# Patient Record
Sex: Female | Born: 1965 | Race: White | Hispanic: No | Smoking: Current every day smoker
Health system: Southern US, Community
[De-identification: ages and names within clinical notes are randomized; demographics above are authoritative.]

---

## 2004-09-12 ENCOUNTER — Emergency Department: Payer: Self-pay | Admitting: Emergency Medicine

## 2008-10-21 ENCOUNTER — Emergency Department: Payer: Self-pay | Admitting: Emergency Medicine

## 2008-12-22 ENCOUNTER — Emergency Department: Payer: Self-pay | Admitting: Emergency Medicine

## 2009-01-04 ENCOUNTER — Emergency Department: Payer: Self-pay | Admitting: Emergency Medicine

## 2009-03-07 ENCOUNTER — Emergency Department: Payer: Self-pay | Admitting: Internal Medicine

## 2012-05-16 ENCOUNTER — Emergency Department: Payer: Self-pay | Admitting: Emergency Medicine

## 2012-05-22 LAB — WOUND CULTURE

## 2013-03-06 ENCOUNTER — Emergency Department: Payer: Self-pay | Admitting: Internal Medicine

## 2014-01-02 ENCOUNTER — Emergency Department: Payer: Self-pay | Admitting: Emergency Medicine

## 2014-06-28 ENCOUNTER — Emergency Department: Payer: Self-pay | Admitting: Emergency Medicine

## 2014-06-30 ENCOUNTER — Emergency Department: Payer: Self-pay | Admitting: Internal Medicine

## 2014-06-30 LAB — CBC
HCT: 35.3 % (ref 35.0–47.0)
HGB: 11.3 g/dL — ABNORMAL LOW (ref 12.0–16.0)
MCH: 26 pg (ref 26.0–34.0)
MCHC: 31.9 g/dL — AB (ref 32.0–36.0)
MCV: 81 fL (ref 80–100)
Platelet: 140 10*3/uL — ABNORMAL LOW (ref 150–440)
RBC: 4.33 10*6/uL (ref 3.80–5.20)
RDW: 15.8 % — AB (ref 11.5–14.5)
WBC: 3.8 10*3/uL (ref 3.6–11.0)

## 2014-06-30 LAB — BASIC METABOLIC PANEL
Anion Gap: 8 (ref 7–16)
BUN: 26 mg/dL — AB (ref 7–18)
CALCIUM: 8.2 mg/dL — AB (ref 8.5–10.1)
Chloride: 110 mmol/L — ABNORMAL HIGH (ref 98–107)
Co2: 21 mmol/L (ref 21–32)
Creatinine: 1.57 mg/dL — ABNORMAL HIGH (ref 0.60–1.30)
EGFR (African American): 46 — ABNORMAL LOW
EGFR (Non-African Amer.): 38 — ABNORMAL LOW
GLUCOSE: 122 mg/dL — AB (ref 65–99)
OSMOLALITY: 284 (ref 275–301)
Potassium: 3.7 mmol/L (ref 3.5–5.1)
Sodium: 139 mmol/L (ref 136–145)

## 2014-07-01 ENCOUNTER — Emergency Department: Payer: Self-pay | Admitting: Emergency Medicine

## 2014-09-28 ENCOUNTER — Emergency Department: Payer: Self-pay | Admitting: Emergency Medicine

## 2014-10-25 ENCOUNTER — Emergency Department: Payer: Self-pay | Admitting: Internal Medicine

## 2014-11-16 ENCOUNTER — Emergency Department: Admit: 2014-11-16 | Discharge status: Against Medical Advice | Payer: Self-pay | Admitting: Emergency Medicine

## 2015-06-07 ENCOUNTER — Emergency Department
Admission: EM | Admit: 2015-06-07 | Discharge: 2015-06-07 | Disposition: A | Discharge status: Home / Self Care | Payer: Self-pay | Attending: Emergency Medicine | Admitting: Emergency Medicine

## 2015-06-07 ENCOUNTER — Encounter: Payer: Self-pay | Admitting: *Deleted

## 2015-06-07 DIAGNOSIS — Y9289 Other specified places as the place of occurrence of the external cause: Secondary | ICD-10-CM | POA: Insufficient documentation

## 2015-06-07 DIAGNOSIS — Y998 Other external cause status: Secondary | ICD-10-CM | POA: Insufficient documentation

## 2015-06-07 DIAGNOSIS — S61011A Laceration without foreign body of right thumb without damage to nail, initial encounter: Secondary | ICD-10-CM | POA: Insufficient documentation

## 2015-06-07 DIAGNOSIS — Z23 Encounter for immunization: Secondary | ICD-10-CM | POA: Insufficient documentation

## 2015-06-07 DIAGNOSIS — Y9389 Activity, other specified: Secondary | ICD-10-CM | POA: Insufficient documentation

## 2015-06-07 DIAGNOSIS — Z720 Tobacco use: Secondary | ICD-10-CM | POA: Insufficient documentation

## 2015-06-07 DIAGNOSIS — Y288XXA Contact with other sharp object, undetermined intent, initial encounter: Secondary | ICD-10-CM | POA: Insufficient documentation

## 2015-06-07 MED ORDER — TRAMADOL HCL 50 MG PO TABS: 50.0000 mg | ORAL_TABLET | Freq: Four times a day (QID) | ORAL | Status: DC | PRN

## 2015-06-07 MED ORDER — TETANUS-DIPHTHERIA TOXOIDS TD 5-2 LFU IM INJ: 0.5000 mL | INJECTION | Freq: Once | INTRAMUSCULAR | Status: AC

## 2015-06-07 MED ORDER — TETANUS-DIPHTH-ACELL PERTUSSIS 5-2.5-18.5 LF-MCG/0.5 IM SUSP
INTRAMUSCULAR | Status: AC
  Administered 2015-06-07: 0.5 mL via INTRAMUSCULAR
  Filled 2015-06-07: qty 0.5

## 2015-06-07 MED ORDER — BACITRACIN ZINC 500 UNIT/GM EX OINT
TOPICAL_OINTMENT | Freq: Two times a day (BID) | CUTANEOUS | Status: DC
  Administered 2015-06-07: 17:00:00 via TOPICAL

## 2015-06-07 MED ORDER — TRIPLE ANTIBIOTIC 3.5-400-5000 EX OINT: TOPICAL_OINTMENT | Freq: Once | CUTANEOUS | Status: DC

## 2015-06-07 MED ORDER — BACITRACIN ZINC 500 UNIT/GM EX OINT
TOPICAL_OINTMENT | CUTANEOUS | Status: AC
  Filled 2015-06-07: qty 0.9

## 2015-06-07 MED ORDER — CEPHALEXIN 250 MG/5ML PO SUSR: 250.0000 mg | Freq: Four times a day (QID) | ORAL | Status: DC

## 2015-06-07 MED ORDER — IBUPROFEN 800 MG PO TABS: 800.0000 mg | ORAL_TABLET | Freq: Three times a day (TID) | ORAL | Status: DC | PRN

## 2015-06-07 NOTE — ED Provider Notes (Signed)
[facility] Emergency Department Provider Note  ____________________________________________  Time seen: Approximately 4:31 PM  I have reviewed the triage vital signs and the nursing notes.   HISTORY  Chief Complaint Laceration    HPI Jessica Harper is a 49 y.o. female patient presented with a 2 day old laceration to the base of right thumb. He states she was cut by a piece of aluminum. Patient states she cleaned area with hydrogen peroxide and applied Neosporin. Patient awakened this morning with erythematous and edematous right thumb. Patient rates the pain scales 8/10 described as "throbbing". Patient denies any discharge from the wound site. She denies any loss of sensation or loss of function of the affected digit. She is right-hand dominant.   History reviewed. No pertinent past medical history.  There are no active problems to display for this patient.   History reviewed. No pertinent past surgical history.  Current Outpatient Rx  Name  Route  Sig  Dispense  Refill  . cephALEXin (KEFLEX) 250 MG/5ML suspension   Oral   Take 5 mLs (250 mg total) by mouth 4 (four) times daily.   200 mL   0   . ibuprofen (ADVIL,MOTRIN) 800 MG tablet   Oral   Take 1 tablet (800 mg total) by mouth every 8 (eight) hours as needed for moderate pain.   15 tablet   0   . traMADol (ULTRAM) 50 MG tablet   Oral   Take 1 tablet (50 mg total) by mouth every 6 (six) hours as needed for moderate pain.   12 tablet   0     Allergies Sulfa antibiotics  No family history on file.  Social History Social History  Substance Use Topics  . Smoking status: Current Every Day Smoker  . Smokeless tobacco: None  . Alcohol Use: No    Review of Systems Constitutional: No fever/chills Eyes: No visual changes. ENT: No sore throat. Cardiovascular: Denies chest pain. Respiratory: Denies shortness of breath. Gastrointestinal: No abdominal pain.  No nausea, no vomiting.   No diarrhea.  No constipation. Genitourinary: Negative for dysuria. Musculoskeletal: Negative for back pain. Skin: Negative for rash. Laceration base of right thumb Neurological: Negative for headaches, focal weakness or numbness. Allergic/Immunilogical: Sulfa  10-point ROS otherwise negative.  ____________________________________________   PHYSICAL EXAM:  VITAL SIGNS: ED Triage Vitals  Enc Vitals Group     BP 06/07/15 1613 132/76 mmHg     Pulse Rate 06/07/15 1613 92     Resp 06/07/15 1613 20     Temp 06/07/15 1613 98.2 F (36.8 C)     Temp Source 06/07/15 1613 Oral     SpO2 06/07/15 1613 99 %     Weight 06/07/15 1613 145 lb (65.772 kg)     Height 06/07/15 1613 5\' 5"  (1.651 m)     Head Cir --      Peak Flow --      Pain Score 06/07/15 1614 8     Pain Loc --      Pain Edu? --      Excl. in GC? --     Constitutional: Alert and oriented. Well appearing and in no acute distress. Eyes: Conjunctivae are normal. PERRL. EOMI. Head: Atraumatic. Nose: No congestion/rhinnorhea. Mouth/Throat: Mucous membranes are moist.  Oropharynx non-erythematous. Neck: No stridor. No cervical spine tenderness to palpation. Hematological/Lymphatic/Immunilogical: No cervical lymphadenopathy. Cardiovascular: Normal rate, regular rhythm. Grossly normal heart sounds.  Good peripheral circulation. Respiratory: Normal respiratory effort.  No retractions. Lungs  CTAB. Gastrointestinal: Soft and nontender. No distention. No abdominal bruits. No CVA tenderness. Musculoskeletal: No lower extremity tenderness nor edema.  No joint effusions. Neurologic:  Normal speech and language. No gross focal neurologic deficits are appreciated. No gait instability. Skin:  Skin is warm, dry and intact. No rash noted. Erythema mild edema to the base of the right thumb. 0.5 cm laceration Psychiatric: Mood and affect are normal. Speech and behavior are normal.  ____________________________________________   LABS (all  labs ordered are listed, but only abnormal results are displayed)  Labs Reviewed - No data to display ____________________________________________  EKG   ____________________________________________  RADIOLOGY   ____________________________________________   PROCEDURES  Procedure(s) performed: None  Critical Care performed: No  ____________________________________________   INITIAL IMPRESSION / ASSESSMENT AND PLAN / ED COURSE  Pertinent labs & imaging results that were available during my care of the patient were reviewed by me and considered in my medical decision making (see chart for details).  Laceration base of right thumb greater than 24 hours. Area was cleaned, Neosporin applied, and bandage. She given a tetanus shot. She given a prescription for Keflex, Toradol, and ibuprofen. Patient given home care instructions. Patient advised to return back to ER if condition worsens. ____________________________________________   FINAL CLINICAL IMPRESSION(S) / ED DIAGNOSES  Final diagnoses:  Laceration of right thumb with delay in treatment, initial encounter      Joni Reining, PA-C 06/07/15 1640  Jene Every, MD 06/07/15 [phone]

## 2015-06-07 NOTE — ED Notes (Signed)
Pt has small  Laceration to base of right thumb.  Cut yesterday with aluminum siding.

## 2015-09-29 ENCOUNTER — Emergency Department
Admission: EM | Admit: 2015-09-29 | Discharge: 2015-09-29 | Disposition: A | Discharge status: Home / Self Care | Payer: Self-pay | Attending: Emergency Medicine | Admitting: Emergency Medicine

## 2015-09-29 ENCOUNTER — Encounter: Payer: Self-pay | Admitting: Emergency Medicine

## 2015-09-29 DIAGNOSIS — N611 Abscess of the breast and nipple: Secondary | ICD-10-CM | POA: Insufficient documentation

## 2015-09-29 DIAGNOSIS — F17210 Nicotine dependence, cigarettes, uncomplicated: Secondary | ICD-10-CM | POA: Insufficient documentation

## 2015-09-29 NOTE — ED Notes (Signed)
L breast sore began 4 days ago, now draining, no injury at onset.

## 2015-09-30 ENCOUNTER — Emergency Department
Admission: EM | Admit: 2015-09-30 | Discharge: 2015-09-30 | Disposition: A | Discharge status: Home / Self Care | Payer: Self-pay | Attending: Emergency Medicine | Admitting: Emergency Medicine

## 2015-09-30 DIAGNOSIS — Z792 Long term (current) use of antibiotics: Secondary | ICD-10-CM | POA: Insufficient documentation

## 2015-09-30 DIAGNOSIS — N611 Abscess of the breast and nipple: Secondary | ICD-10-CM | POA: Insufficient documentation

## 2015-09-30 DIAGNOSIS — F17210 Nicotine dependence, cigarettes, uncomplicated: Secondary | ICD-10-CM | POA: Insufficient documentation

## 2015-09-30 MED ORDER — CLINDAMYCIN HCL 300 MG PO CAPS: 300.0000 mg | ORAL_CAPSULE | Freq: Three times a day (TID) | ORAL | Status: DC

## 2015-09-30 NOTE — ED Notes (Signed)
Assessment per PA 

## 2015-09-30 NOTE — ED Provider Notes (Signed)
CSN: [id number]     Arrival date & time 09/30/15  1450 History   First MD Initiated Contact with Patient 09/30/15 1638     Chief Complaint  Patient presents with  . Insect Bite  . Cellulitis     (Consider location/radiation/quality/duration/timing/severity/associated sxs/prior Treatment) HPI  50 year old female presents immersed department for evaluation of left breast wound. Patient states 4 days ago she developed a nickel size area of redness along the left lateral breast just along the areola. Over the next couple days this grew in size from nickel sized to quarter sized. Yesterday, pain was moderate to severe, wound ruptured and there was moderate amount of drainage. Did have pain relief. She is now left with a wound to the left breast. Her pain is mild. She is not taking any medications. She denies any fevers.  No past medical history on file. No past surgical history on file. No family history on file. Social History  Substance Use Topics  . Smoking status: Current Every Day Smoker -- 1.00 packs/day    Types: Cigarettes  . Smokeless tobacco: Not on file  . Alcohol Use: No   OB History    No data available     Review of Systems  Constitutional: Negative for fever, chills, activity change and fatigue.  HENT: Negative for congestion, sinus pressure and sore throat.   Eyes: Negative for visual disturbance.  Respiratory: Negative for cough, chest tightness and shortness of breath.   Cardiovascular: Negative for chest pain and leg swelling.  Gastrointestinal: Negative for nausea, vomiting, abdominal pain and diarrhea.  Genitourinary: Negative for dysuria.  Musculoskeletal: Negative for arthralgias and gait problem.  Skin: Positive for wound. Negative for rash.  Neurological: Negative for weakness, numbness and headaches.  Hematological: Negative for adenopathy.  Psychiatric/Behavioral: Negative for behavioral problems, confusion and agitation.      Allergies  Sulfa  antibiotics  Home Medications   Prior to Admission medications   Medication Sig Start Date End Date Taking? Authorizing Provider  cephALEXin (KEFLEX) 250 MG/5ML suspension Take 5 mLs (250 mg total) by mouth 4 (four) times daily. 06/07/15   Joni Reining, PA-C  clindamycin (CLEOCIN) 300 MG capsule Take 1 capsule (300 mg total) by mouth 3 (three) times daily. 09/30/15   Evon Slack, PA-C  ibuprofen (ADVIL,MOTRIN) 800 MG tablet Take 1 tablet (800 mg total) by mouth every 8 (eight) hours as needed for moderate pain. 06/07/15   Joni Reining, PA-C  traMADol (ULTRAM) 50 MG tablet Take 1 tablet (50 mg total) by mouth every 6 (six) hours as needed for moderate pain. 06/07/15   Joni Reining, PA-C   BP 168/94 mmHg  Pulse 89  Temp(Src) 98.3 F (36.8 C) (Oral)  Resp 18  Ht  (1.651 m)  Wt 65.772 kg  BMI 24.13 kg/m2  SpO2 99% Physical Exam  Constitutional: She is oriented to person, place, and time. She appears well-developed and well-nourished. No distress.  HENT:  Head: Normocephalic and atraumatic.  Mouth/Throat: Oropharynx is clear and moist.  Eyes: EOM are normal. Pupils are equal, round, and reactive to light. Right eye exhibits no discharge. Left eye exhibits no discharge.  Neck: Normal range of motion. Neck supple.  Cardiovascular: Normal rate, regular rhythm and intact distal pulses.   Pulmonary/Chest: Effort normal and breath sounds normal. No respiratory distress. She exhibits no tenderness.  Abdominal: Soft. She exhibits no distension. There is no tenderness.  Musculoskeletal: Normal range of motion. She exhibits no edema.  Neurological: She is alert and oriented to person, place, and time. She has normal reflexes.  Skin:  On the left lateral breast just outside of the areaola there is a 2 cm in diameter shallow ulceration with no induration warmth or surrounding erythema. There is no active drainage. Breast is minimally tender to palpation. No other nodules palpated  throughout the breast.  Psychiatric: She has a normal mood and affect. Her behavior is normal. Thought content normal.    ED Course  Procedures (including critical care time) Labs Review Labs Reviewed - No data to display  Imaging Review No results found. I have personally reviewed and evaluated these images and lab results as part of my medical decision-making.   EKG Interpretation None      MDM   Final diagnoses:  Left breast abscess    50 year old female with left breast abscess that drained yesterday on its own. Patient had pain relief but is now left with wound to the left breast. There is no induration or fluctuance. Vital signs are stable, afebrile. Patient is placed on clindamycin 300 mg 3 times a day 10 days. She has an appointment to see Dr. in 3 days for recheck. Would recommend them remeasuring to make sure ulceration is not growing. Today approximately 2 cm in diameter.    Evon Slack, PA-C 09/30/15 1721  Jennye Moccasin, MD 10/01/15 [phone]

## 2015-09-30 NOTE — ED Notes (Signed)
Patient to ER for c/o possible spider bite to left breast. States area in center is approx dime sized and black in color. Area around center is red and warm, approx size of 50 cent piece. Denies any fevers. Reports GI distress mildly yesterday.

## 2015-09-30 NOTE — Discharge Instructions (Signed)
Please continue to keep the left breast abscess covered and clean. Apply thin layer of antibiotic ointment daily. Start clindamycin 300 mg every 8 hours 10 days. Follow-up with primary care doctor in 3 days. Return to the ER for any increased pain, swelling, redness, warmth, fevers or for any urgent changes in her health.Jessica Harper

## 2015-09-30 NOTE — ED Provider Notes (Signed)
Patient cannot afford clindamycin, called in Keflex to her pharmacy as she is allergic to Bactrim  Jene Every, MD 09/30/15 [phone]

## 2015-09-30 NOTE — ED Notes (Signed)
Pt c/o pain returning to left breast. Denies fevers. Abscess had drained spontaneously. AOx4. NAD

## 2015-10-01 ENCOUNTER — Telehealth: Payer: Self-pay | Admitting: Emergency Medicine

## 2015-10-01 NOTE — ED Notes (Signed)
Pt had called me this am and cannot afford the clindamycin.  Says she spoke with someone last night and they were going to call something else to [facility] [address], but they did not get it.  i explained that we would rather she actually got the clindamycin and informed her of med management.  icalled her contact just now as she had told me her phone was turned off.  He says he will see her in 30 min and agrees to tell her to go to med management for her rx.

## 2016-01-10 ENCOUNTER — Encounter: Payer: Self-pay | Admitting: *Deleted

## 2016-01-10 ENCOUNTER — Emergency Department: Payer: Self-pay

## 2016-01-10 ENCOUNTER — Emergency Department
Admission: EM | Admit: 2016-01-10 | Discharge: 2016-01-10 | Disposition: A | Discharge status: Home / Self Care | Payer: Self-pay | Attending: Emergency Medicine | Admitting: Emergency Medicine

## 2016-01-10 DIAGNOSIS — Z79899 Other long term (current) drug therapy: Secondary | ICD-10-CM | POA: Insufficient documentation

## 2016-01-10 DIAGNOSIS — L0291 Cutaneous abscess, unspecified: Secondary | ICD-10-CM

## 2016-01-10 DIAGNOSIS — L988 Other specified disorders of the skin and subcutaneous tissue: Secondary | ICD-10-CM

## 2016-01-10 DIAGNOSIS — F17210 Nicotine dependence, cigarettes, uncomplicated: Secondary | ICD-10-CM | POA: Insufficient documentation

## 2016-01-10 DIAGNOSIS — N611 Abscess of the breast and nipple: Secondary | ICD-10-CM | POA: Insufficient documentation

## 2016-01-10 DIAGNOSIS — N649 Disorder of breast, unspecified: Secondary | ICD-10-CM

## 2016-01-10 MED ORDER — CEPHALEXIN 500 MG PO CAPS: 500.0000 mg | ORAL_CAPSULE | Freq: Four times a day (QID) | ORAL | Status: AC

## 2016-01-10 MED ORDER — IBUPROFEN 600 MG PO TABS
600.0000 mg | ORAL_TABLET | Freq: Four times a day (QID) | ORAL | Status: DC | PRN
Start: 2016-01-10 — End: 2016-02-27

## 2016-01-10 NOTE — ED Notes (Signed)
Pt reports abscess on left breast, pt noticed abscess on Monday

## 2016-01-10 NOTE — ED Provider Notes (Signed)
[facility] Emergency Department Provider Note  ____________________________________________  Time seen: Approximately 9:55 AM  I have reviewed the triage vital signs and the nursing notes.   HISTORY  Chief Complaint Abscess    HPI Jessica Harper is a 50 y.o. female, NAD, who presents to the emergency department with 10 day history of a right breast skin sore. The skin sore is accompanied by erythema, breast pain, and drainage.  Had a similar lesion about the same breast proximally 2 months ago and was given antibiotics in which helped. She admits to occasional low back pain. She denies fever, chills, night sweats, weight loss, abdominal pain, nausea, vomiting.  She does not perform self breast exams and has never had a mammogram and cites lack of insurance and means as reasons. Her grandmother and mother both had benign lumpectomies in the past. States she is a 1ppd smoker which makes her concerned in regards to her breast cancer risk.   History reviewed. No pertinent past medical history.  There are no active problems to display for this patient.   History reviewed. No pertinent past surgical history.  Current Outpatient Rx  Name  Route  Sig  Dispense  Refill  . cephALEXin (KEFLEX) 500 MG capsule   Oral   Take 1 capsule (500 mg total) by mouth 4 (four) times daily.   40 capsule   0   . ibuprofen (ADVIL,MOTRIN) 600 MG tablet   Oral   Take 1 tablet (600 mg total) by mouth every 6 (six) hours as needed.   30 tablet   0   . traMADol (ULTRAM) 50 MG tablet   Oral   Take 1 tablet (50 mg total) by mouth every 6 (six) hours as needed for moderate pain.   12 tablet   0     Allergies Sulfa antibiotics  No family history on file.  Social History Social History  Substance Use Topics  . Smoking status: Current Every Day Smoker -- 1.00 packs/day    Types: Cigarettes  . Smokeless tobacco: None  . Alcohol Use: No     Review of Systems   Constitutional: No fever/chills, fatigue, night sweats Eyes: No visual changes.  Cardiovascular: No chest pain. Respiratory: No shortness of breath.  Gastrointestinal: No abdominal pain.  No nausea, vomiting.  Musculoskeletal: Positive for occasional low back pain. No general myalgias Skin: Positive for skin sores of left breast and right axilla. Negative for rash. Neurological: Negative for headaches, focal weakness or numbness. No tingling 10-point ROS otherwise negative.  ____________________________________________   PHYSICAL EXAM:  VITAL SIGNS: ED Triage Vitals  Enc Vitals Group     BP --      Pulse --      Resp --      Temp --      Temp src --      SpO2 --      Weight --      Height --      Head Cir --      Peak Flow --      Pain Score 01/10/16 0909 5     Pain Loc --      Pain Edu? --      Excl. in GC? --     History and Physical exam completed in the presence of Huntley Dec, PA-S  Constitutional: Alert and oriented. Well appearing and in no acute distress. Eyes: Conjunctivae are normal.  Head: Atraumatic. ENT:  Mouth/Throat: Mucous membranes are moist.  Neck: Supple with FROM Hematological/Lymphatic/Immunilogical: No cervical lymphadenopathy. Cardiovascular: Normal rate, regular rhythm. Normal S1 and S2.  Good peripheral circulation. Respiratory: Normal respiratory effort without tachypnea or retractions. Lungs CTAB. Musculoskeletal: No lower extremity tenderness nor edema.  No joint effusions. Neurologic:  Normal speech and language. No gross focal neurologic deficits are appreciated.  Skin: Open, draining 0.75cm skin sore 1inch to the right from areola of left breast surrounded by streaking erythema, warmth, and tenderness to palpation. Closed, mildly erythematous, tender skin sore 1inch to left from areola of left breast. 3-4 closed, tender, cystic lesions in medial right axilla surrounded by erythema, no warmth. Right breast mildly tender to  palpation of periareolar and inferior area. No masses, nipple discharge, or skin tone changes noted in breasts bilaterally. Skin is warm and dry. No rash noted. Psychiatric: Mood and affect are normal. Speech and behavior are normal. Patient exhibits appropriate insight and judgement.   ____________________________________________   LABS  None  ____________________________________________  EKG  None ____________________________________________  RADIOLOGY I have personally viewed and evaluated these images (plain radiographs) as part of my medical decision making, as well as reviewing the written report by the radiologist.  [facility] [facility]  01/10/2016  CLINICAL DATA:  Concern for left breast abscess. The patient reports developing a bump in her medial left breast next to her nipple which subsequently ruptured and drained. In addition, the patient reports surrounding redness of her breast. She states that she had a similar area in the lateral aspect of her left breast 2 months ago for which she was treated with antibiotics with improvement. EXAM: ULTRASOUND OF THE LEFT BREAST COMPARISON:  None. FINDINGS: On physical exam, there is a superficial ulceration of the left breast at 9 o'clock, 1 cm from the nipple with surrounding erythema. In addition, there is scarring of the lateral left breast in the region of patient's prior infection. Targeted ultrasound is performed, showing a superficial skin ulceration at 9 o'clock, 1 cm from the nipple measuring approximately 6 x 5 x 2 mm. No underlying fluid collection or mass is identified. IMPRESSION: Left breast skin infection.  No underlying breast abscess. RECOMMENDATION: 1. Clinical follow-up to resolution of the patient's left breast skin infection following appropriate therapy. 2. After resolution of the patient's acute symptoms, a bilateral mammogram is recommended. The patient denies any prior mammograms. Findings and  recommendations were discussed with Tye SavoyJami Garwood Wentzell in the emergency room at 11:35 a.m. on 01/10/2016. I have discussed the findings and recommendations with the patient. Results were also provided in writing at the conclusion of the visit. If applicable, a reminder letter will be sent to the patient regarding the next appointment. BI-RADS CATEGORY  2: Benign Finding(s) Electronically Signed   By: Dalphine HandingErin  Shaw M.D.   On: 01/10/2016 11:49    ____________________________________________    PROCEDURES  Procedure(s) performed: None    Medications - No data to display   ____________________________________________   INITIAL IMPRESSION / ASSESSMENT AND PLAN / ED COURSE  Pertinent imaging results that were available during my care of the patient were reviewed by me and considered in my medical decision making (see chart for details).  Patient's diagnosis is consistent with abscess of left breast. Patient will be discharged home with prescriptions for Keflex and ibuprofen to take as directed. [facility] as well as should complete mammography as soon as possible. Patient is given  ED precautions to return to the ED for any worsening or new symptoms.      ____________________________________________  FINAL CLINICAL IMPRESSION(S) / ED DIAGNOSES  Final diagnoses:  Skin lesion of breast  Abscess of left breast      NEW MEDICATIONS STARTED DURING THIS VISIT:  Discharge Medication List as of 01/10/2016 11:44 AM    START taking these medications   Details  cephALEXin (KEFLEX) 500 MG capsule Take 1 capsule (500 mg total) by mouth 4 (four) times daily., Starting 01/10/2016, Until Sun 01/20/16, Print             [facility], PA-C 01/10/16 1225  Sharyn Creamer, MD 01/11/16 [phone]

## 2016-01-10 NOTE — Discharge Instructions (Signed)
Abscess An abscess is an infected area that contains a collection of pus and debris.It can occur in almost any part of the body. An abscess is also known as a furuncle or boil. CAUSES  An abscess occurs when tissue gets infected. This can occur from blockage of oil or sweat glands, infection of hair follicles, or a minor injury to the skin. As the body tries to fight the infection, pus collects in the area and creates pressure under the skin. This pressure causes pain. People with weakened immune systems have difficulty fighting infections and get certain abscesses more often.  SYMPTOMS Usually an abscess develops on the skin and becomes a painful mass that is red, warm, and tender. If the abscess forms under the skin, you may feel a moveable soft area under the skin. Some abscesses break open (rupture) on their own, but most will continue to get worse without care. The infection can spread deeper into the body and eventually into the bloodstream, causing you to feel ill.  DIAGNOSIS  Your caregiver will take your medical history and perform a physical exam. A sample of fluid may also be taken from the abscess to determine what is causing your infection. TREATMENT  Your caregiver may prescribe antibiotic medicines to fight the infection. However, taking antibiotics alone usually does not cure an abscess. Your caregiver may need to make a small cut (incision) in the abscess to drain the pus. In some cases, gauze is packed into the abscess to reduce pain and to continue draining the area. HOME CARE INSTRUCTIONS   Only take over-the-counter or prescription medicines for pain, discomfort, or fever as directed by your caregiver.  If you were prescribed antibiotics, take them as directed. Finish them even if you start to feel better.  If gauze is used, follow your caregiver's directions for changing the gauze.  To avoid spreading the infection:  Keep your draining abscess covered with a  bandage.  Wash your hands well.  Do not share personal care items, towels, or whirlpools with others.  Avoid skin contact with others.  Keep your skin and clothes clean around the abscess.  Keep all follow-up appointments as directed by your caregiver. SEEK MEDICAL CARE IF:   You have increased pain, swelling, redness, fluid drainage, or bleeding.  You have muscle aches, chills, or a general ill feeling.  You have a fever. MAKE SURE YOU:   Understand these instructions.  Will watch your condition.  Will get help right away if you are not doing well or get worse.   This information is not intended to replace advice given to you by your health care provider. Make sure you discuss any questions you have with your health care provider.   Document Released: 05/07/2005 Document Revised: 01/27/2012 Document Reviewed: 10/10/2011 Elsevier Interactive Patient Education 2016 [employer]   Mammogram  A mammogram is an X-ray of the breasts that is done to check for abnormal changes. This procedure can screen for and detect any changes that may suggest breast cancer. A mammogram can also identify other changes and variations in the breast, such as:  Inflammation of the breast tissue (mastitis).  An infected area that contains a collection of pus (abscess).  A fluid-filled sac (cyst).  Fibrocystic changes. This is when breast tissue becomes denser, which can make the tissue feel rope-like or uneven under the skin.  Tumors that are not cancerous (benign). LET [facility] PROVIDER KNOW ABOUT:  Any allergies you have.  If you  have breast implants.  If you have had previous breast disease, biopsy, or surgery.  If you are breastfeeding.  Any possibility that you could be pregnant, if this applies.  If you are younger th76an age 25.  If you have a family history of breast cancer. RISKS AND COMPLICATIONS  Generally, this is a safe procedure. However, problems may occur, including:   Exposure to radiation. Radiation levels are very low with this test.  The results being misinterpreted.  The need for further tests.  The inability of the mammogram to detect certain cancers. BEFORE THE PROCEDURE  Schedule your test about 1-2 weeks after your menstrual period. This is usually when your breasts are the least tender.  If you have had a mammogram done at a different facility in the past, get the mammogram X-rays or have them sent to your current exam facility in order to compare them.  Wash your breasts and under your arms the day of the test.  Do not wear deodorants, perfumes, lotions, or powders anywhere on your body on the day of the test.  Remove any jewelry from your neck.  Wear clothes that you can change into and out of easily. PROCEDURE  You will undress from the waist up and put on a gown.  You will stand in front of the X-ray machine.  Each breast will be placed between two plastic or glass plates. The plates will compress your breast for a few seconds. Try to stay as relaxed as possible during the procedure. This does not cause any harm to your breasts and any discomfort you feel will be very brief.  X-rays will be taken from different angles of each breast. The procedure may vary among health care providers and hospitals.  AFTER THE PROCEDURE  The mammogram will be examined by a specialist (radiologist).  You may need to repeat certain parts of the test, depending on the quality of the images. This is commonly done if the radiologist needs a better view of the breast tissue.  Ask when your test results will be ready. Make sure you get your test results.  You may resume your normal activities. This information is not intended to replace advice given to you by your health care provider. Make sure you discuss any questions you have with your health care provider.  Document Released: 07/25/2000 Document Revised: 04/18/2015 Document Reviewed: 10/06/2014  Elsevier  Interactive Patient Education [employer]

## 2016-01-10 NOTE — ED Notes (Addendum)
Pt to ed with c/o abscess to left breast on right side of nipple.  Pt reports similar issue about 1 month ago on the left breast left side of nipple.  Area is approx 1mm in diameter, small amount of drainage noted.  Redness noted to left breast. Tender to touch

## 2016-02-27 ENCOUNTER — Emergency Department
Admission: EM | Admit: 2016-02-27 | Discharge: 2016-02-27 | Disposition: A | Discharge status: Home / Self Care | Payer: Self-pay | Attending: Emergency Medicine | Admitting: Emergency Medicine

## 2016-02-27 DIAGNOSIS — K047 Periapical abscess without sinus: Secondary | ICD-10-CM | POA: Insufficient documentation

## 2016-02-27 DIAGNOSIS — F17210 Nicotine dependence, cigarettes, uncomplicated: Secondary | ICD-10-CM | POA: Insufficient documentation

## 2016-02-27 MED ORDER — AMOXICILLIN 500 MG PO CAPS: 500.0000 mg | ORAL_CAPSULE | Freq: Three times a day (TID) | ORAL | Status: AC

## 2016-02-27 MED ORDER — TRAMADOL HCL 50 MG PO TABS: 50.0000 mg | ORAL_TABLET | Freq: Four times a day (QID) | ORAL | Status: AC | PRN

## 2016-02-27 NOTE — ED Notes (Signed)
Left sided dental pain. Pt unable to have relief from pain by taking ibuprofen PTA. Pt alert and oriented X4, active, cooperative, pt in NAD. RR even and unlabored, color WNL.  Pt able to talk in complete and full sentences.

## 2016-02-27 NOTE — ED Notes (Signed)
Pt reports left sided dental abscess on upper side of mouth. Pt reports taking 800mg  ibu about 2.5 hours PTA with no relief.

## 2016-02-27 NOTE — ED Notes (Signed)
Pt alert and oriented X4, active, cooperative, pt in NAD. RR even and unlabored, color WNL.  Pt informed to return if any life threatening symptoms occur.   

## 2016-02-27 NOTE — Discharge Instructions (Signed)
Dental Abscess A dental abscess is pus in or around a tooth. HOME CARE  Take medicines only as told by your dentist.  If you were prescribed antibiotic medicine, finish all of it even if you start to feel better.  Rinse your mouth (gargle) often with salt water.  Do not drive or use heavy machinery, like a lawn mower, while taking pain medicine.  Do not apply heat to the outside of your mouth.  Keep all follow-up visits as told by your dentist. This is important. GET HELP IF:  Your pain is worse, and medicine does not help. GET HELP RIGHT AWAY IF:  You have a fever or chills.  Your symptoms suddenly get worse.  You have a very bad headache.  You have problems breathing or swallowing.  You have trouble opening your mouth.  You have puffiness (swelling) in your neck or around your eye.   This information is not intended to replace advice given to you by your health care provider. Make sure you discuss any questions you have with your health care provider.   Document Released: 12/12/2014 Document Reviewed: 12/12/2014 Elsevier Interactive Patient Education [employer]2016 Elsevier Inc.   Begin taking antibiotics today as directed. He may continue taking ibuprofen for pain and inflammation. Taking tramadol only as directed. Follow-up with one of the dental clinics listed on your discharge papers.   OPTIONS FOR DENTAL FOLLOW UP CARE  [place] Department of Health and Human Services - Local Safety Net Dental Clinics TripDoors.comhttp://www.ncdhhs.gov/dph/oralhealth/services/safetynetclinics.htm   [facility] [phone])  [facility] [phone])  [facility] [phone])  [facility] [phone])  [facility] [phone]) This clinic caters to the indigent population and is on a lottery system. Location: [employer] [facility] [employer] [facility] [address], [address], [place] Clinic Hours: Wednesdays from 6pm - 9pm,  patients seen by a lottery system. For dates, call or go to [email or web address] Services: Cleanings, fillings and simple extractions. Payment Options: DENTAL WORK IS FREE OF CHARGE. Bring proof of income or support. Best way to get seen: Arrive at 5:15 pm - this is a lottery, NOT first come/first serve, so arriving earlier will not increase your chances of being seen.     [facility] [phone] Select option 1 for emergencies   Location: [facility], [address], [address], [place] [facility] Hours: No walk-ins accepted - call the day before to schedule an appointment. Check in times are 9:30 am and 1:30 pm. Services: Simple extractions, temporary fillings, pulpectomy/pulp debridement, uncomplicated abscess drainage. Payment Options: PAYMENT IS DUE AT THE TIME OF SERVICE.  Fee is usually $100-200, additional surgical procedures (e.g. abscess drainage) may be extra. Cash, checks, Visa/MasterCard accepted.  Can file Medicaid if patient is covered for dental - patient should call case worker to check. No discount for [facility] patients. Best way to get seen: MUST call the day before and get onto the schedule. Can usually be seen the next 1-2 days. No walk-ins accepted.     [facility] [phone]   Location: [facility], [address], [place] [facility] Hours: M, W, Th, F 8am or 1:30pm, Tues 9a or 1:30 - first come/first served. Services: Simple extractions, temporary fillings, uncomplicated abscess drainage.  You do not need to be an [place] resident. Payment Options: PAYMENT IS DUE AT THE TIME OF SERVICE. Dental insurance, otherwise sliding scale - bring proof of income or support.  Depending on income and treatment needed, cost is usually $50-200. Best way to get seen: Arrive early as it is first come/first served.     [facility] [phone]   Location: [address] Clinic Hours: Mon-Thu 8a-5p Services: Most basic dental services including extractions and fillings. Payment Options: PAYMENT IS DUE AT THE TIME OF SERVICE. Sliding scale, up to 50% off - bring proof if income or support. Medicaid with dental option accepted. Best way to get seen: Call to schedule an appointment, can usually be seen within 2 weeks OR they will try to see walk-ins - show up at 8a or 2p (you may have to wait).     [facility] [phone] [place] RESIDENTS ONLY   Location: [facility], [address], [place], [place] [address] Clinic Hours: By appointment only. Monday - Thursday 8am-5pm, Friday 8am-12pm Services: Cleanings, fillings, extractions. Payment Options: PAYMENT IS DUE AT THE TIME OF SERVICE. Cash, Visa or MasterCard. Sliding scale - $30 minimum per service. Best way to get seen: Come in to office, complete packet and make an appointment - need proof of income or support monies for each household member and proof of [facility] residence. Usually takes about a month to get in.     [facility] [phone]   Location: [address] [facility] Clinic Hours: Walk-in Urgent Care Dental Services are offered Monday-Friday mornings only. The numbers of emergencies accepted daily is limited to the number of providers available. Maximum 15 - Mondays, Wednesdays & Thursdays Maximum 10 - Tuesdays & Fridays Services: You do not need to be a [facility] resident to be seen for a dental emergency. Emergencies are defined as pain, swelling, abnormal bleeding, or dental trauma. Walkins will receive x-rays if needed. NOTE: Dental cleaning is not an emergency. Payment Options: PAYMENT IS DUE AT THE TIME OF SERVICE. Minimum co-pay is $40.00 for uninsured patients. Minimum co-pay is $3.00 for Medicaid with dental  coverage. Dental Insurance is accepted and must be presented at time of visit. Medicare does not cover dental. Forms of payment: Cash, credit card, checks. Best way to get seen: If not previously registered with the clinic, walk-in dental registration begins at 7:15 am and is on a first come/first serve basis. If previously registered with the clinic, call to make an appointment.     [facility] [phone] [place] RESIDENTS ONLY   Location: [address] [place], [place] Clinic Hours: Mon-Thu 10a-2p Services: Extractions only! Payment Options: FREE (donations accepted) - bring proof of income or support Best way to get seen: Call and schedule an appointment OR come at 8am on the 1st Monday of every month (except for holidays) when it is first come/first served.     [facility] [phone]   Location: [address] [place], [place] Clinic Hours: Friday mornings Services, Payment Options, Best way to get seen: Call for info

## 2016-02-27 NOTE — ED Provider Notes (Signed)
[facility] Emergency Department Provider Note   ____________________________________________  Time seen: Approximately 5:55 PM  I have reviewed the triage vital signs and the nursing notes.   HISTORY  Chief Complaint Dental Pain   HPI Jessica Harper is a 50 y.o. female is here with complaint of left-sided dental pain. Patient states that it began today and she has been taking ibuprofen without any relief. Patient is unaware of any fever or chills. She states that currently she does not have a dentist. She is concerned because she is getting some swelling on the left side of her face. Currently she rates her pain as 8 out of 10.   No past medical history on file.  There are no active problems to display for this patient.   No past surgical history on file.  Current Outpatient Rx  Name  Route  Sig  Dispense  Refill  . amoxicillin (AMOXIL) 500 MG capsule   Oral   Take 1 capsule (500 mg total) by mouth 3 (three) times daily.   30 capsule   0   . traMADol (ULTRAM) 50 MG tablet   Oral   Take 1 tablet (50 mg total) by mouth every 6 (six) hours as needed.   15 tablet   0     Allergies Sulfa antibiotics  No family history on file.  Social History Social History  Substance Use Topics  . Smoking status: Current Every Day Smoker -- 1.00 packs/day    Types: Cigarettes  . Smokeless tobacco: Not on file  . Alcohol Use: No    Review of Systems Constitutional: No fever/chills Eyes: No visual changes. ENT: No sore throat.Positive dental pain. Cardiovascular: Denies chest pain. Respiratory: Denies shortness of breath. Gastrointestinal:   No nausea, no vomiting.  Neurological: Negative for headaches, focal weakness or numbness.  10-point ROS otherwise negative.  ____________________________________________   PHYSICAL EXAM:  VITAL SIGNS: ED Triage Vitals  Enc Vitals Group     BP 02/27/16 1719 164/106 mmHg     Pulse Rate 02/27/16  1719 80     Resp 02/27/16 1719 19     Temp 02/27/16 1719 98.4 F (36.9 C)     Temp Source 02/27/16 1719 Oral     SpO2 02/27/16 1719 100 %     Weight 02/27/16 1719 150 lb (68.04 kg)     Height 02/27/16 1719 5\' 5"  (1.651 m)     Head Cir --      Peak Flow --      Pain Score 02/27/16 1720 8     Pain Loc --      Pain Edu? --      Excl. in GC? --     Constitutional: Alert and oriented. Well appearing and in no acute distress. Eyes: Conjunctivae are normal. PERRL. EOMI. Head: Atraumatic. Nose: No congestion/rhinnorhea. Mouth/Throat: Mucous membranes are moist.  Oropharynx non-erythematous. Left upper posterior molar with large cavity present. Gums are slightly edematous and tender surrounding this area. Neck: No stridor.   Hematological/Lymphatic/Immunilogical: No cervical lymphadenopathy. Cardiovascular: Normal rate, regular rhythm. Grossly normal heart sounds.  Good peripheral circulation. Respiratory: Normal respiratory effort.  No retractions. Lungs CTAB. Musculoskeletal: Moves upper and lower extremities without any difficulty. Normal gait was noted. Neurologic:  Normal speech and language. No gross focal neurologic deficits are appreciated. No gait instability. Skin:  Skin is warm, dry and intact. No rash noted. Psychiatric: Mood and affect are normal. Speech and behavior are normal.  ____________________________________________  LABS (all labs ordered are listed, but only abnormal results are displayed)  Labs Reviewed - No data to display   PROCEDURES  Procedure(s) performed: None  Procedures  Critical Care performed: No  ____________________________________________   INITIAL IMPRESSION / ASSESSMENT AND PLAN / ED COURSE  Pertinent labs & imaging results that were available during my care of the patient were reviewed by me and considered in my medical decision making (see chart for details).  Patient was given list of dental clinics in the area to call to  possibly be seen. Patient also was given a prescription for amoxicillin 500 mg 3 times a day for 10 days along with tramadol one every 6 hours as needed for severe pain #15 no refill. Patient was instructed to continue taking ibuprofen for pain and inflammation. ____________________________________________   FINAL CLINICAL IMPRESSION(S) / ED DIAGNOSES  Final diagnoses:  Dental abscess      NEW MEDICATIONS STARTED DURING THIS VISIT:  Discharge Medication List as of 02/27/2016  6:03 PM    START taking these medications   Details  amoxicillin (AMOXIL) 500 MG capsule Take 1 capsule (500 mg total) by mouth 3 (three) times daily., Starting 02/27/2016, Until Discontinued, Print         Note:  This document was prepared using Dragon voice recognition software and may include unintentional dictation errors.    Tommi Rumps, PA-C 02/27/16 1808  Sharman Cheek, MD 02/27/16 2131

## 2016-04-11 ENCOUNTER — Emergency Department
Admission: EM | Admit: 2016-04-11 | Discharge: 2016-04-11 | Disposition: A | Discharge status: Home / Self Care | Payer: Self-pay | Attending: Emergency Medicine | Admitting: Emergency Medicine

## 2016-04-11 ENCOUNTER — Encounter: Payer: Self-pay | Admitting: Emergency Medicine

## 2016-04-11 DIAGNOSIS — Y9289 Other specified places as the place of occurrence of the external cause: Secondary | ICD-10-CM | POA: Insufficient documentation

## 2016-04-11 DIAGNOSIS — W57XXXA Bitten or stung by nonvenomous insect and other nonvenomous arthropods, initial encounter: Secondary | ICD-10-CM | POA: Insufficient documentation

## 2016-04-11 DIAGNOSIS — L03311 Cellulitis of abdominal wall: Secondary | ICD-10-CM | POA: Insufficient documentation

## 2016-04-11 DIAGNOSIS — Y999 Unspecified external cause status: Secondary | ICD-10-CM | POA: Insufficient documentation

## 2016-04-11 DIAGNOSIS — F17210 Nicotine dependence, cigarettes, uncomplicated: Secondary | ICD-10-CM | POA: Insufficient documentation

## 2016-04-11 DIAGNOSIS — Y9389 Activity, other specified: Secondary | ICD-10-CM | POA: Insufficient documentation

## 2016-04-11 MED ORDER — DOXYCYCLINE HYCLATE 100 MG PO TABS: 100.0000 mg | ORAL_TABLET | Freq: Two times a day (BID) | ORAL | 0 refills | Status: DC

## 2016-04-11 MED ORDER — DOXYCYCLINE HYCLATE 100 MG PO TABS
100.0000 mg | ORAL_TABLET | Freq: Once | ORAL | Status: AC
  Administered 2016-04-11: 100 mg via ORAL
  Filled 2016-04-11: qty 1

## 2016-04-11 NOTE — ED Provider Notes (Signed)
[facility] Emergency Department Provider Note ____________________________________________  Time seen: 1314  I have reviewed the triage vital signs and the nursing notes.  HISTORY  Chief Complaint  Insect Bite  HPI Jessica Harper is a 50 y.o. female presents to the ED for evaluation of an open wound to the abdomen noted over the last 5 days. Patient describes that she got what she thought initially was insect bite to her lower abdomen. She then, she been keeping the area covered with toilet tissue for the drainage. She is noted over the last 4 days consistent bloody drainage from the area. Today, she noted "meat" hanging from the center of the open wound. She is also noticed some subjective fevers but denies any nausea, vomiting, or diarrhea.  History reviewed. No pertinent past medical history.  There are no active problems to display for this patient.  History reviewed. No pertinent surgical history.  Prior to Admission medications   Medication Sig Start Date End Date Taking? Authorizing Provider  amoxicillin (AMOXIL) 500 MG capsule Take 1 capsule (500 mg total) by mouth 3 (three) times daily. 02/27/16   Tommi Rumpshonda L Summers, PA-C  doxycycline (VIBRA-TABS) 100 MG tablet Take 1 tablet (100 mg total) by mouth 2 (two) times daily. 04/11/16   Peg Fifer V Bacon Isador Castille, PA-C  traMADol (ULTRAM) 50 MG tablet Take 1 tablet (50 mg total) by mouth every 6 (six) hours as needed. 02/27/16   Tommi Rumpshonda L Summers, PA-C   Allergies Sulfa antibiotics  No family history on file.  Social History Social History  Substance Use Topics  . Smoking status: Current Every Day Smoker    Packs/day: 1.00    Types: Cigarettes  . Smokeless tobacco: Not on file  . Alcohol use No    Review of Systems  Constitutional: Negative for fever. Cardiovascular: Negative for chest pain. Respiratory: Negative for shortness of breath. Gastrointestinal: Negative for abdominal pain, vomiting and  diarrhea. Musculoskeletal: Negative for back pain. Skin: Negative for rash. Open wound of the abdomen as above.  Neurological: Negative for headaches, focal weakness or numbness. ____________________________________________  PHYSICAL EXAM:  VITAL SIGNS: ED Triage Vitals [04/11/16 1203]  Enc Vitals Group     BP (!) 156/105     Pulse Rate 90     Resp 16     Temp 98 F (36.7 C)     Temp Source Oral     SpO2 99 %     Weight 145 lb (65.8 kg)     Height 5\' 5"  (1.651 m)     Head Circumference      Peak Flow      Pain Score 7     Pain Loc      Pain Edu?      Excl. in GC?    Constitutional: Alert and oriented. Well appearing and in no distress. Head: Normocephalic and atraumatic. Cardiovascular: Normal rate, regular rhythm.  Respiratory: Normal respiratory effort. No wheezes/rales/rhonchi. Gastrointestinal: Soft and nontender. No distention. Patient with a local cellulitic lesion to the lower abdomen. The lesion measures about 37 m in diameter with roll borders. Central area of granulation tissue with central eschar is appreciated. Some mild cloudy serosanguineous drainage is expressed. No surrounding erythema is noted. Musculoskeletal: Nontender with normal range of motion in all extremities.  Neurologic:  Normal gait without ataxia. Normal speech and language. No gross focal neurologic deficits are appreciated. Skin:  Skin is warm, dry and intact. No rash noted. ____________________________________________  PROCEDURES  Doxycycline 100  mg PO Wound cleansed with cleanser Wet-to-dry dressing applied ____________________________________________  INITIAL IMPRESSION / ASSESSMENT AND PLAN / ED COURSE  Patient with an acute local cellulitis to the lower abdomen. Patient is started on the appropriate about her regimen and given supplies for ongoing wound dressing. She will monitor the area and keep it clean, dry, and covered. She is advised to return to the ED for any signs of worsening  infection including spreading of redness, warmth, or purulent drainage.  Clinical Course   ____________________________________________  FINAL CLINICAL IMPRESSION(S) / ED DIAGNOSES  Final diagnoses:  Cellulitis of abdominal wall      Lissa Hoard, PA-C 04/11/16 1414    Phineas Semen, MD 04/11/16 1441

## 2016-04-11 NOTE — ED Triage Notes (Signed)
Pt to ed with c/o ?insect bite to lower abd.  Pt with noted yellow drainage x 4 days.  Skin red, warm and tender to touch.

## 2016-04-11 NOTE — Discharge Instructions (Signed)
Take the antibiotic as directed. Dress the wound daily with the supplies provided. Return to the ED as needed for signs of worsening infection as discussed.

## 2017-05-22 ENCOUNTER — Emergency Department
Admission: EM | Admit: 2017-05-22 | Discharge: 2017-05-22 | Disposition: A | Discharge status: Home / Self Care | Payer: Self-pay | Attending: Emergency Medicine | Admitting: Emergency Medicine

## 2017-05-22 ENCOUNTER — Telehealth: Payer: Self-pay | Admitting: Emergency Medicine

## 2017-05-22 DIAGNOSIS — R42 Dizziness and giddiness: Secondary | ICD-10-CM | POA: Insufficient documentation

## 2017-05-22 DIAGNOSIS — R600 Localized edema: Secondary | ICD-10-CM | POA: Insufficient documentation

## 2017-05-22 DIAGNOSIS — Z5321 Procedure and treatment not carried out due to patient leaving prior to being seen by health care provider: Secondary | ICD-10-CM | POA: Insufficient documentation

## 2017-05-22 DIAGNOSIS — R6884 Jaw pain: Secondary | ICD-10-CM | POA: Insufficient documentation

## 2017-05-22 LAB — COMPREHENSIVE METABOLIC PANEL
ALBUMIN: 3.6 g/dL (ref 3.5–5.0)
ALT: 41 U/L (ref 14–54)
AST: 39 U/L (ref 15–41)
Alkaline Phosphatase: 82 U/L (ref 38–126)
Anion gap: 6 (ref 5–15)
BUN: 17 mg/dL (ref 6–20)
CHLORIDE: 110 mmol/L (ref 101–111)
CO2: 24 mmol/L (ref 22–32)
Calcium: 8.8 mg/dL — ABNORMAL LOW (ref 8.9–10.3)
Creatinine, Ser: 1.18 mg/dL — ABNORMAL HIGH (ref 0.44–1.00)
GFR calc Af Amer: >60 mL/min (ref >60)
GFR, EST NON AFRICAN AMERICAN: 53 mL/min — AB (ref >60)
GLUCOSE: 117 mg/dL — AB (ref 65–99)
POTASSIUM: 3.1 mmol/L — AB (ref 3.5–5.1)
SODIUM: 140 mmol/L (ref 135–145)
Total Bilirubin: 0.6 mg/dL (ref 0.3–1.2)
Total Protein: 7.6 g/dL (ref 6.5–8.1)

## 2017-05-22 LAB — CBC
HCT: 38.2 % (ref 35.0–47.0)
Hemoglobin: 13.2 g/dL (ref 12.0–16.0)
MCH: 29.4 pg (ref 26.0–34.0)
MCHC: 34.7 g/dL (ref 32.0–36.0)
MCV: 84.8 fL (ref 80.0–100.0)
PLATELETS: 137 10*3/uL — AB (ref 150–440)
RBC: 4.5 MIL/uL (ref 3.80–5.20)
RDW: 13.6 % (ref 11.5–14.5)
WBC: 6.6 10*3/uL (ref 3.6–11.0)

## 2017-05-22 LAB — TROPONIN I

## 2017-05-22 NOTE — Telephone Encounter (Signed)
Called patient due to lwot to inquire about condition and follow up plans.mailbox is full, so could not leave message.

## 2017-05-22 NOTE — ED Triage Notes (Signed)
Pt states she believes she was bitter by spider to left jaw area after walking through spider web. Co swelling to left jaw with some pain. Also co dizziness at this time, pt did not see insect.

## 2018-01-11 ENCOUNTER — Ambulatory Visit: Payer: Self-pay | Attending: Oncology | Admitting: *Deleted

## 2018-01-11 ENCOUNTER — Ambulatory Visit
Admission: RE | Admit: 2018-01-11 | Discharge: 2018-01-11 | Disposition: A | Discharge status: Home / Self Care | Payer: Self-pay | Source: Ambulatory Visit | Attending: Oncology | Admitting: Oncology

## 2018-01-11 VITALS — BP 107/71 | HR 86 | Temp 98.7°F | Ht 65.0 in | Wt 154.0 lb

## 2018-01-11 DIAGNOSIS — Z0000 Encounter for general adult medical examination without abnormal findings: Secondary | ICD-10-CM

## 2018-01-11 NOTE — Progress Notes (Signed)
  Subjective:     Patient ID: Jessica Harper, female   DOB: 10/18/1965, 52 y.o.   MRN: [id number]  HPI   Review of Systems     Objective:   Physical Exam  Pulmonary/Chest: Right breast exhibits tenderness. Right breast exhibits no inverted nipple, no mass, no nipple discharge and no skin change. Left breast exhibits tenderness. Left breast exhibits no inverted nipple, no mass, no nipple discharge and no skin change. Breasts are symmetrical.       Assessment:     52 year old White [facility] and [facility].  Clinical breast with tenderness bilateral.  Patient drinks 6 plus cups / glasses of caffeinated drinks a day.  She is to cut back on some of her caffeine.  Taught self breast exam.  Last pap on 11/17/17 was negative without HPV co-testing.  Next pap due in 2022.  Patient has been screened for eligibility.  She does not have any insurance, Medicare or Medicaid.  She also meets financial eligibility.  Hand-out given on the Affordable Care Act.    Plan:     Screening mammogram ordered.  Will follow-up per [facility] protocol.

## 2018-01-12 ENCOUNTER — Encounter: Payer: Self-pay | Admitting: *Deleted

## 2018-01-12 NOTE — Patient Instructions (Signed)
Gave patient hand-out, Women Staying Healthy, Active and Well from [facility], with education on breast health, pap smears, heart and colon health. 

## 2018-01-12 NOTE — Progress Notes (Signed)
Letter mailed from the [facility] to inform patient of her normal mammogram results.  Patient is to follow-up with annual screening in one year.  HSIS to [name]. 

## 2020-06-10 ENCOUNTER — Emergency Department: Payer: No Typology Code available for payment source

## 2020-06-10 ENCOUNTER — Emergency Department
Admission: EM | Admit: 2020-06-10 | Discharge: 2020-06-10 | Disposition: A | Discharge status: Home / Self Care | Payer: No Typology Code available for payment source | Attending: Emergency Medicine | Admitting: Emergency Medicine

## 2020-06-10 ENCOUNTER — Other Ambulatory Visit: Payer: Self-pay

## 2020-06-10 DIAGNOSIS — F17210 Nicotine dependence, cigarettes, uncomplicated: Secondary | ICD-10-CM | POA: Insufficient documentation

## 2020-06-10 DIAGNOSIS — S161XXA Strain of muscle, fascia and tendon at neck level, initial encounter: Secondary | ICD-10-CM | POA: Diagnosis not present

## 2020-06-10 DIAGNOSIS — S0990XA Unspecified injury of head, initial encounter: Secondary | ICD-10-CM | POA: Diagnosis present

## 2020-06-10 MED ORDER — MELOXICAM 7.5 MG PO TABS
15.0000 mg | ORAL_TABLET | Freq: Once | ORAL | Status: AC
  Administered 2020-06-10: 15 mg via ORAL
  Filled 2020-06-10: qty 2

## 2020-06-10 MED ORDER — METHOCARBAMOL 500 MG PO TABS
1000.0000 mg | ORAL_TABLET | Freq: Once | ORAL | Status: AC
  Administered 2020-06-10: 1000 mg via ORAL
  Filled 2020-06-10: qty 2

## 2020-06-10 MED ORDER — MELOXICAM 15 MG PO TABS: 15.0000 mg | ORAL_TABLET | Freq: Every day | ORAL | 0 refills | Status: AC

## 2020-06-10 MED ORDER — METHOCARBAMOL 500 MG PO TABS: 500.0000 mg | ORAL_TABLET | Freq: Four times a day (QID) | ORAL | 0 refills | Status: AC
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
## 2022-12-30 ENCOUNTER — Emergency Department
Admission: EM | Admit: 2022-12-30 | Discharge: 2022-12-30 | Disposition: A | Discharge status: Home / Self Care | Payer: BLUE CROSS/BLUE SHIELD | Attending: Emergency Medicine | Admitting: Emergency Medicine

## 2022-12-30 ENCOUNTER — Other Ambulatory Visit: Payer: Self-pay

## 2022-12-30 ENCOUNTER — Encounter: Payer: Self-pay | Admitting: Emergency Medicine

## 2022-12-30 DIAGNOSIS — L03012 Cellulitis of left finger: Secondary | ICD-10-CM | POA: Insufficient documentation

## 2022-12-30 DIAGNOSIS — M79645 Pain in left finger(s): Secondary | ICD-10-CM | POA: Diagnosis present

## 2022-12-30 MED ORDER — DOXYCYCLINE HYCLATE 100 MG PO TABS: 100.0000 mg | ORAL_TABLET | Freq: Two times a day (BID) | ORAL | 0 refills | Status: AC

## 2022-12-30 NOTE — ED Provider Notes (Signed)
[facility] Provider Note  Patient Contact: 3:47 PM (approximate)   History   Hand Pain (L thumb)   HPI  Jessica Harper is a 57 y.o. female who presents the emergency department complaining of a hangnail that get caught, now appears to have an infection around the fingernail.  Patient has a small fluid collection along the lateral aspect of the nail.  No fevers or chills.  Patient has full range of motion to the finger.  No edema, erythema or fluid extends past the interphalangeal joint.     Physical Exam   Triage Vital Signs: ED Triage Vitals  Enc Vitals Group     BP 12/30/22 1355 (!) 150/102     Pulse Rate 12/30/22 1355 94     Resp 12/30/22 1355 17     Temp 12/30/22 1355 97.7 F (36.5 C)     Temp Source 12/30/22 1355 Oral     SpO2 12/30/22 1355 98 %     Weight 12/30/22 1357 160 lb (72.6 kg)     Height 12/30/22 1357 5\' 5"  (1.651 m)     Head Circumference --      Peak Flow --      Pain Score 12/30/22 1357 8     Pain Loc --      Pain Edu? --      Excl. in GC? --     Most recent vital signs: Vitals:   12/30/22 1355  BP: (!) 150/102  Pulse: 94  Resp: 17  Temp: 97.7 F (36.5 C)  SpO2: 98%     General: Alert and in no acute distress.  Cardiovascular:  Good peripheral perfusion Respiratory: Normal respiratory effort without tachypnea or retractions. Lungs CTAB.  Musculoskeletal: Full range of motion to all extremities.  Visualization of the thumb reveals findings consistent with paronychia.  There is no open wounds.  No felon's.  No signs of infectious tenosynovitis.  Full range of motion to the digits preserved.  Sensation cap refill preserved. Neurologic:  No gross focal neurologic deficits are appreciated.  Skin:   No rash noted Other:   ED Results / Procedures / Treatments   Labs (all labs ordered are listed, but only abnormal results are displayed) Labs Reviewed - No data to display   EKG     RADIOLOGY    No  results found.  PROCEDURES:  Critical Care performed: No  Procedures   MEDICATIONS ORDERED IN ED: Medications - No data to display   IMPRESSION / MDM / ASSESSMENT AND PLAN / ED COURSE  I reviewed the triage vital signs and the nursing notes.                                 Differential diagnosis includes, but is not limited to, paronychia, felon's, infectious tenosynovitis   Patient's presentation is most consistent with acute presentation with potential threat to life or bodily function.   Patient's diagnosis is consistent with paronychia.  Patient presents the emergency department complaining of possible infection after having a hangnail traumatically removed.  She states that the edge of the nail caught on a piece of clothing and ripped backwards.  Patient is now developed some signs of a paronychia.  Recommend antibiotics, warm compresses.  Concerning signs and symptoms and return precaution discussed with the patient.  Follow-up primary care as needed..  Patient is given ED precautions to return to the ED for  any worsening or new symptoms.     FINAL CLINICAL IMPRESSION(S) / ED DIAGNOSES   Final diagnoses:  Paronychia of left thumb     Rx / DC Orders   ED Discharge Orders          Ordered    doxycycline (VIBRA-TABS) 100 MG tablet  2 times daily        12/30/22 1632             Note:  This document was prepared using Dragon voice recognition software and may include unintentional dictation errors.   Lanette Hampshire 12/30/22 1632    Corena Herter, MD 12/30/22 2352

## 2022-12-30 NOTE — ED Triage Notes (Signed)
Pt via POV from home. Pt c/o L thumb pain and swelling, states he had a hang nail, states that it was caught on a sweater and it pulled it off yesterday. Pt now having increased pain and swelling. Pt is A&Ox4 and NAD
